# Patient Record
Sex: Female | Born: 1980 | Race: White | Hispanic: No | Marital: Married | Smoking: Never smoker
Health system: Southern US, Community
[De-identification: ages and names within clinical notes are randomized; demographics above are authoritative.]

## PROBLEM LIST (undated history)

## (undated) DIAGNOSIS — H409 Unspecified glaucoma: Secondary | ICD-10-CM

## (undated) DIAGNOSIS — K5090 Crohn's disease, unspecified, without complications: Secondary | ICD-10-CM

## (undated) DIAGNOSIS — F909 Attention-deficit hyperactivity disorder, unspecified type: Secondary | ICD-10-CM

## (undated) DIAGNOSIS — Q858 Other phakomatoses, not elsewhere classified: Secondary | ICD-10-CM

## (undated) DIAGNOSIS — E039 Hypothyroidism, unspecified: Secondary | ICD-10-CM

## (undated) DIAGNOSIS — Q8589 Other phakomatoses, not elsewhere classified: Secondary | ICD-10-CM

## (undated) DIAGNOSIS — N289 Disorder of kidney and ureter, unspecified: Secondary | ICD-10-CM

## (undated) HISTORY — PX: EYE SURGERY: SHX253

---

## 2006-05-28 ENCOUNTER — Emergency Department: Payer: Self-pay | Admitting: Emergency Medicine

## 2012-09-22 ENCOUNTER — Emergency Department: Payer: Self-pay | Admitting: Emergency Medicine

## 2012-09-22 LAB — COMPREHENSIVE METABOLIC PANEL
Albumin: 3.9 g/dL (ref 3.4–5.0)
Alkaline Phosphatase: 55 U/L (ref 50–136)
Anion Gap: 5 — ABNORMAL LOW (ref 7–16)
BUN: 17 mg/dL (ref 7–18)
Bilirubin,Total: 0.6 mg/dL (ref 0.2–1.0)
Calcium, Total: 8.8 mg/dL (ref 8.5–10.1)
Chloride: 107 mmol/L (ref 98–107)
Co2: 24 mmol/L (ref 21–32)
Creatinine: 1.11 mg/dL (ref 0.60–1.30)
EGFR (African American): >60
EGFR (Non-African Amer.): >60
Osmolality: 273 (ref 275–301)
Potassium: 3.4 mmol/L — ABNORMAL LOW (ref 3.5–5.1)
SGOT(AST): 25 U/L (ref 15–37)
SGPT (ALT): 30 U/L (ref 12–78)
Sodium: 136 mmol/L (ref 136–145)

## 2012-09-22 LAB — PREGNANCY, URINE: Pregnancy Test, Urine: NEGATIVE m[IU]/mL

## 2012-09-22 LAB — URINALYSIS, COMPLETE
Ketone: NEGATIVE
Leukocyte Esterase: NEGATIVE
Ph: 6 (ref 4.5–8.0)
RBC,UR: 1 /HPF (ref 0–5)
Specific Gravity: 1.002 (ref 1.003–1.030)
WBC UR: <1 /HPF (ref 0–5)

## 2012-09-22 LAB — CBC
HCT: 36.9 % (ref 35.0–47.0)
HGB: 12.1 g/dL (ref 12.0–16.0)
MCH: 31.2 pg (ref 26.0–34.0)
MCHC: 32.8 g/dL (ref 32.0–36.0)
RBC: 3.88 10*6/uL (ref 3.80–5.20)
RDW: 13.1 % (ref 11.5–14.5)

## 2012-10-14 ENCOUNTER — Other Ambulatory Visit: Payer: Self-pay | Admitting: Gastroenterology

## 2012-10-14 LAB — CLOSTRIDIUM DIFFICILE BY PCR

## 2012-11-08 ENCOUNTER — Other Ambulatory Visit: Payer: Self-pay | Admitting: Gastroenterology

## 2012-11-08 LAB — CLOSTRIDIUM DIFFICILE BY PCR

## 2012-12-09 ENCOUNTER — Other Ambulatory Visit: Payer: Self-pay | Admitting: Gastroenterology

## 2012-12-09 LAB — CLOSTRIDIUM DIFFICILE BY PCR

## 2012-12-26 ENCOUNTER — Other Ambulatory Visit: Payer: Self-pay | Admitting: Gastroenterology

## 2012-12-26 LAB — CLOSTRIDIUM DIFFICILE BY PCR

## 2013-03-06 ENCOUNTER — Other Ambulatory Visit: Payer: Self-pay | Admitting: Family

## 2013-03-06 LAB — COMPREHENSIVE METABOLIC PANEL
BUN: 11 mg/dL (ref 7–18)
Bilirubin,Total: 0.5 mg/dL (ref 0.2–1.0)
Calcium, Total: 9 mg/dL (ref 8.5–10.1)
Creatinine: 1.15 mg/dL (ref 0.60–1.30)
EGFR (Non-African Amer.): >60
Glucose: 88 mg/dL (ref 65–99)
Potassium: 3.5 mmol/L (ref 3.5–5.1)
Sodium: 137 mmol/L (ref 136–145)
Total Protein: 8.4 g/dL — ABNORMAL HIGH (ref 6.4–8.2)

## 2013-03-06 LAB — CBC WITH DIFFERENTIAL/PLATELET
Basophil #: 0.1 10*3/uL (ref 0.0–0.1)
Basophil %: 0.8 %
Eosinophil %: 2.4 %
HGB: 12.8 g/dL (ref 12.0–16.0)
Lymphocyte %: 18.5 %
MCHC: 34.9 g/dL (ref 32.0–36.0)
MCV: 92 fL (ref 80–100)
Monocyte %: 9 %
Neutrophil #: 5.3 10*3/uL (ref 1.4–6.5)
Platelet: 340 10*3/uL (ref 150–440)
RBC: 3.97 10*6/uL (ref 3.80–5.20)
WBC: 7.6 10*3/uL (ref 3.6–11.0)

## 2013-03-06 LAB — IRON AND TIBC: Iron Saturation: 32 %

## 2013-03-06 LAB — SEDIMENTATION RATE: Erythrocyte Sed Rate: 61 mm/hr — ABNORMAL HIGH (ref 0–20)

## 2013-04-21 ENCOUNTER — Ambulatory Visit: Payer: Self-pay | Admitting: Family Medicine

## 2014-06-02 ENCOUNTER — Ambulatory Visit: Payer: Self-pay | Admitting: Neurology

## 2014-06-19 ENCOUNTER — Ambulatory Visit: Payer: Self-pay | Admitting: Neurology

## 2014-09-22 ENCOUNTER — Emergency Department: Payer: Self-pay | Admitting: Emergency Medicine

## 2017-02-12 ENCOUNTER — Other Ambulatory Visit: Payer: Self-pay | Admitting: Family Medicine

## 2017-02-12 DIAGNOSIS — E039 Hypothyroidism, unspecified: Secondary | ICD-10-CM

## 2017-02-12 DIAGNOSIS — E041 Nontoxic single thyroid nodule: Secondary | ICD-10-CM

## 2017-02-21 ENCOUNTER — Ambulatory Visit
Admission: RE | Admit: 2017-02-21 | Discharge: 2017-02-21 | Disposition: A | Discharge status: Home / Self Care | Payer: Commercial Managed Care - PPO | Source: Ambulatory Visit | Attending: Family Medicine | Admitting: Family Medicine

## 2017-02-21 DIAGNOSIS — E041 Nontoxic single thyroid nodule: Secondary | ICD-10-CM

## 2017-02-21 DIAGNOSIS — N261 Atrophy of kidney (terminal): Secondary | ICD-10-CM | POA: Insufficient documentation

## 2017-02-21 DIAGNOSIS — E039 Hypothyroidism, unspecified: Secondary | ICD-10-CM

## 2018-08-21 ENCOUNTER — Other Ambulatory Visit: Payer: Self-pay | Admitting: Family Medicine

## 2018-08-21 DIAGNOSIS — E041 Nontoxic single thyroid nodule: Secondary | ICD-10-CM

## 2019-11-21 ENCOUNTER — Other Ambulatory Visit: Payer: Self-pay | Admitting: Family Medicine

## 2019-11-21 DIAGNOSIS — Q858 Other phakomatoses, not elsewhere classified: Secondary | ICD-10-CM

## 2019-11-21 DIAGNOSIS — H42 Glaucoma in diseases classified elsewhere: Secondary | ICD-10-CM

## 2019-11-21 DIAGNOSIS — Q8589 Other phakomatoses, not elsewhere classified: Secondary | ICD-10-CM

## 2019-11-21 DIAGNOSIS — E049 Nontoxic goiter, unspecified: Secondary | ICD-10-CM

## 2019-12-01 ENCOUNTER — Other Ambulatory Visit: Payer: Self-pay

## 2019-12-01 ENCOUNTER — Ambulatory Visit
Admission: RE | Admit: 2019-12-01 | Discharge: 2019-12-01 | Disposition: A | Discharge status: Home / Self Care | Payer: BC Managed Care – PPO | Source: Ambulatory Visit | Attending: Family Medicine | Admitting: Family Medicine

## 2019-12-01 DIAGNOSIS — Q8589 Other phakomatoses, not elsewhere classified: Secondary | ICD-10-CM

## 2019-12-01 DIAGNOSIS — Q858 Other phakomatoses, not elsewhere classified: Secondary | ICD-10-CM | POA: Insufficient documentation

## 2019-12-01 DIAGNOSIS — E049 Nontoxic goiter, unspecified: Secondary | ICD-10-CM | POA: Insufficient documentation

## 2019-12-01 DIAGNOSIS — H42 Glaucoma in diseases classified elsewhere: Secondary | ICD-10-CM | POA: Diagnosis present

## 2020-12-10 ENCOUNTER — Encounter: Payer: Self-pay | Admitting: Emergency Medicine

## 2020-12-10 ENCOUNTER — Emergency Department
Admission: EM | Admit: 2020-12-10 | Discharge: 2020-12-10 | Disposition: A | Discharge status: Home / Self Care | Payer: No Typology Code available for payment source | Attending: Emergency Medicine | Admitting: Emergency Medicine

## 2020-12-10 ENCOUNTER — Other Ambulatory Visit: Payer: Self-pay

## 2020-12-10 ENCOUNTER — Emergency Department: Payer: No Typology Code available for payment source

## 2020-12-10 DIAGNOSIS — Q8589 Other phakomatoses, not elsewhere classified: Secondary | ICD-10-CM

## 2020-12-10 DIAGNOSIS — R55 Syncope and collapse: Secondary | ICD-10-CM | POA: Insufficient documentation

## 2020-12-10 DIAGNOSIS — I679 Cerebrovascular disease, unspecified: Secondary | ICD-10-CM | POA: Diagnosis not present

## 2020-12-10 DIAGNOSIS — W228XXA Striking against or struck by other objects, initial encounter: Secondary | ICD-10-CM | POA: Insufficient documentation

## 2020-12-10 DIAGNOSIS — Z23 Encounter for immunization: Secondary | ICD-10-CM | POA: Insufficient documentation

## 2020-12-10 DIAGNOSIS — Z91040 Latex allergy status: Secondary | ICD-10-CM | POA: Diagnosis not present

## 2020-12-10 DIAGNOSIS — Y9301 Activity, walking, marching and hiking: Secondary | ICD-10-CM | POA: Insufficient documentation

## 2020-12-10 DIAGNOSIS — Q858 Other phakomatoses, not elsewhere classified: Secondary | ICD-10-CM

## 2020-12-10 DIAGNOSIS — S00211A Abrasion of right eyelid and periocular area, initial encounter: Secondary | ICD-10-CM | POA: Insufficient documentation

## 2020-12-10 DIAGNOSIS — E039 Hypothyroidism, unspecified: Secondary | ICD-10-CM | POA: Insufficient documentation

## 2020-12-10 DIAGNOSIS — G463 Brain stem stroke syndrome: Secondary | ICD-10-CM | POA: Diagnosis not present

## 2020-12-10 DIAGNOSIS — S0591XA Unspecified injury of right eye and orbit, initial encounter: Secondary | ICD-10-CM | POA: Diagnosis present

## 2020-12-10 HISTORY — DX: Unspecified glaucoma: H40.9

## 2020-12-10 HISTORY — DX: Other phakomatoses, not elsewhere classified: Q85.8

## 2020-12-10 HISTORY — DX: Other phakomatoses, not elsewhere classified: Q85.89

## 2020-12-10 HISTORY — DX: Hypothyroidism, unspecified: E03.9

## 2020-12-10 HISTORY — DX: Crohn's disease, unspecified, without complications: K50.90

## 2020-12-10 LAB — URINALYSIS, COMPLETE (UACMP) WITH MICROSCOPIC
Bacteria, UA: NONE SEEN
Bilirubin Urine: NEGATIVE
Glucose, UA: NEGATIVE mg/dL
Ketones, ur: NEGATIVE mg/dL
Leukocytes,Ua: NEGATIVE
Nitrite: NEGATIVE
Protein, ur: 100 mg/dL — AB
Specific Gravity, Urine: 1.011 (ref 1.005–1.030)
pH: 6 (ref 5.0–8.0)

## 2020-12-10 LAB — BASIC METABOLIC PANEL
Anion gap: 8 (ref 5–15)
BUN: 24 mg/dL — ABNORMAL HIGH (ref 6–20)
CO2: 23 mmol/L (ref 22–32)
Calcium: 8.9 mg/dL (ref 8.9–10.3)
Chloride: 104 mmol/L (ref 98–111)
Creatinine, Ser: 1.4 mg/dL — ABNORMAL HIGH (ref 0.44–1.00)
GFR, Estimated: 50 mL/min — ABNORMAL LOW (ref >60)
Glucose, Bld: 124 mg/dL — ABNORMAL HIGH (ref 70–99)
Potassium: 3.6 mmol/L (ref 3.5–5.1)
Sodium: 135 mmol/L (ref 135–145)

## 2020-12-10 LAB — CBC
HCT: 35.7 % — ABNORMAL LOW (ref 36.0–46.0)
Hemoglobin: 11.9 g/dL — ABNORMAL LOW (ref 12.0–15.0)
MCH: 30.6 pg (ref 26.0–34.0)
MCHC: 33.3 g/dL (ref 30.0–36.0)
MCV: 91.8 fL (ref 80.0–100.0)
Platelets: 391 10*3/uL (ref 150–400)
RBC: 3.89 MIL/uL (ref 3.87–5.11)
RDW: 15.1 % (ref 11.5–15.5)
WBC: 5.3 10*3/uL (ref 4.0–10.5)
nRBC: 0 % (ref 0.0–0.2)

## 2020-12-10 LAB — PREGNANCY, URINE: Preg Test, Ur: NEGATIVE

## 2020-12-10 MED ORDER — ACETAMINOPHEN 500 MG PO TABS
1000.0000 mg | ORAL_TABLET | Freq: Once | ORAL | Status: AC
  Administered 2020-12-10: 1000 mg via ORAL
  Filled 2020-12-10: qty 2

## 2020-12-10 MED ORDER — TETANUS-DIPHTH-ACELL PERTUSSIS 5-2.5-18.5 LF-MCG/0.5 IM SUSY
0.5000 mL | PREFILLED_SYRINGE | Freq: Once | INTRAMUSCULAR | Status: AC
  Administered 2020-12-10: 0.5 mL via INTRAMUSCULAR
  Filled 2020-12-10: qty 0.5

## 2020-12-10 NOTE — ED Triage Notes (Signed)
Pt to ED via [facility] from [place] of [employer] where pt was walking and became lightheaded and fell into brick wall head first. Pt reports LOC. Pt has injury to rt eye brow.

## 2020-12-10 NOTE — Discharge Instructions (Addendum)
Your CT scan is as below and you can discuss this further with Dr. Manuella Ghazi.  Please call them to make an appointment and do not drive until you get follow-up.  Return to the ER if you have recurrent syncopal versus seizure episode, worsening headache, confusion or any other concerns    Atrophy for age with ventricles disproportionate in size with respect to sulci, a stable finding. Calcification along the periphery of the right occipital lobe is stable and consistent with known Sturge-Weber syndrome.   No appreciable mass or hemorrhage. Suspect decreased attenuation adjacent to the lateral ventricles is due to interstitial edema secondary to chronic ventricular enlargement. Mild small vessel disease in the periventricular white matter is possible. No acute infarct is evident on this study.  Per [facility] statutes, patients with seizures are not allowed to drive until  they have been seizure-free for six months. Use caution when using heavy equipment or power tools. Avoid working on ladders or at heights. Take showers instead of baths. Ensure the water temperature is not too high on the home water heater. Do not go swimming alone. When caring for infants or small children, sit down when holding, feeding, or changing them to minimize risk of injury to the child in the event you have a seizure.   Also, Maintain good sleep hygiene. Avoid alcohol.

## 2020-12-10 NOTE — ED Triage Notes (Signed)
Pt to ED via  [facility] with c/o +LOC and seizure like activity. Pt states sturgey-webber syndrome, has hx of seizures but has not had a seizure since she was a kid. Pt with noted abrasion to R eye brow. Pt A&O x4 during triage.

## 2020-12-10 NOTE — ED Notes (Signed)
IV catheter removed intact without complication.  D/C instructions given.  Follow up discussed.  All questions addressed.  Pt advised of no driving.  Understanding verbalized.  Pt left ER via w/c with husband.

## 2020-12-10 NOTE — ED Provider Notes (Addendum)
[facility] Emergency Department Provider Note  ____________________________________________   Event Date/Time   First MD Initiated Contact with Patient 12/10/20 1500     (approximate)  I have reviewed the triage vital signs and the nursing notes.   HISTORY  Chief Complaint Loss of Consciousness    HPI Nicole Moody is a 40 y.o. female with Sturge-Weber syndrome who comes in for syncope.  Patient reports that she did not eat anything yet today.  She was standing in line waiting to check out food from the [employer] store when she started to feel lightheaded and her vision went black and she lost consciousness.  There is report from bystanders that patient hit her head and had some seizure-like activity.  Patient denies any tongue biting, urinary incontinence or postictal.  Patient does have an abrasion above the right eye.  She reports some mild headache constant, occurred just prior to arrival around 4 hours ago.  Patient denies any chest pain, shortness of breath, abdominal pain.  States that she otherwise felt fine this morning.  Patient just got off of her menstruation.  Denies concerns for being pregnant given her partner has a vasectomy.  Patient reports a remote history of seizures as a child but is not on any seizure medications.  Patient is followed by neurology Dr. Brigitte Pulse at [facility].  Denies blood thinner.           Past Medical History:  Diagnosis Date  . Crohn disease ([place])   . Glaucoma   . Hypothyroidism   . Sturge-Weber syndrome ([place])     There are no problems to display for this patient.    Prior to Admission medications   Not on File    Allergies Latex  No family history on file.  Social History Social History   Tobacco Use  . Smoking status: Never Smoker  . Smokeless tobacco: Never Used  Substance Use Topics  . Alcohol use: Not Currently  . Drug use: Yes    Types: Marijuana    Comment: occ      Review  of Systems Constitutional: No fever/chills Eyes: No visual changes. ENT: No sore throat. Cardiovascular: Denies chest pain. Respiratory: Denies shortness of breath. Gastrointestinal: No abdominal pain.  No nausea, no vomiting.  No diarrhea.  No constipation. Genitourinary: Negative for dysuria. Musculoskeletal: Negative for back pain. Skin: Negative for rash. Neurological: Negative for headaches, focal weakness or numbness.  Syncope, seizure-like activity All other ROS negative ____________________________________________   PHYSICAL EXAM:  VITAL SIGNS: ED Triage Vitals  Enc Vitals Group     BP 12/10/20 1137 116/84     Pulse Rate 12/10/20 1137 87     Resp 12/10/20 1137 20     Temp 12/10/20 1137 97.9 F (36.6 C)     Temp Source 12/10/20 1137 Oral     SpO2 12/10/20 1137 97 %     Weight 12/10/20 1138 190 lb (86.2 kg)     Height 12/10/20 1138 5' 3"  (1.6 m)     Head Circumference --      Peak Flow --      Pain Score 12/10/20 1137 4     Pain Loc --      Pain Edu? --      Excl. in [place]? --     Constitutional: Alert and oriented. Well appearing and in no acute distress. Eyes: Conjunctivae are normal. EOMI. small abrasion above the right eye.  No tenderness underneath the orbits Head:  Small abrasion above the right eye Nose: No congestion/rhinnorhea. Mouth/Throat: Mucous membranes are moist.   Neck: No stridor. Trachea Midline. FROM.  No C-spine tenderness Cardiovascular: Normal rate, regular rhythm. Grossly normal heart sounds.  Good peripheral circulation. Respiratory: Normal respiratory effort.  No retractions. Lungs CTAB. Gastrointestinal: Soft and nontender. No distention. No abdominal bruits.  Musculoskeletal: No lower extremity tenderness nor edema.  No joint effusions. Neurologic:  Normal speech and language. No gross focal neurologic deficits are appreciated.  Cranial nerves II through XII are intact.  Equal strength in arms and legs. Skin:  Skin is warm, dry and intact.   Port wine stains noted Psychiatric: Mood and affect are normal. Speech and behavior are normal. GU: Deferred   ____________________________________________   LABS (all labs ordered are listed, but only abnormal results are displayed)  Labs Reviewed  BASIC METABOLIC PANEL - Abnormal; Notable for the following components:      Result Value   Glucose, Bld 124 (*)    BUN 24 (*)    Creatinine, Ser 1.40 (*)    GFR, Estimated 50 (*)    All other components within normal limits  CBC - Abnormal; Notable for the following components:   Hemoglobin 11.9 (*)    HCT 35.7 (*)    All other components within normal limits  URINALYSIS, COMPLETE (UACMP) WITH MICROSCOPIC - Abnormal; Notable for the following components:   Color, Urine YELLOW (*)    APPearance HAZY (*)    Hgb urine dipstick LARGE (*)    Protein, ur 100 (*)    All other components within normal limits  CBG MONITORING, ED  POC URINE PREG, ED   ____________________________________________   ED ECG REPORT I, Vanessa Logan, the attending physician, personally viewed and interpreted this ECG.  Sinus rate of 82, no ST elevation, no T wave inversions PAC, normal intervals ____________________________________________  RADIOLOGY   Official radiology report(s): CT Head Wo Contrast  Result Date: 12/10/2020 CLINICAL DATA:  Seizure-like activity of loss of consciousness history of Sturge-Weber syndrome EXAM: CT HEAD WITHOUT CONTRAST TECHNIQUE: Contiguous axial images were obtained from the base of the skull through the vertex without intravenous contrast. COMPARISON:  Head CT June 02, 2014; brain MRI June 19, 2014 FINDINGS: Brain: Ventricular dilatation is stable compared to the 2015 study. There is a lesser degree of sulcal atrophy but nonetheless advanced for age. Dystrophic calcification along the periphery of the right occipital lobe is stable and consistent with known Sturge-Weber syndrome. Localized atrophy noted in this area.  There is no appreciable mass. No acute hemorrhage, extra-axial fluid collection, or midline shift. There is decreased attenuation in the periventricular white matter which may represent mild interstitial edema secondary to ventricular enlargement. There may also be a degree of periventricular small vessel disease. No acute infarct appreciable. Vascular: No evident hyperdense vessels. No vascular calcifications are evident. Skull: Bony calvarium   appears intact. Sinuses/Orbits: Visualized paranasal sinuses are clear. Visualized orbits appear symmetric bilaterally. Other: Mastoid air cells are clear. IMPRESSION: Atrophy for age with ventricles disproportionate in size with respect to sulci, a stable finding. Calcification along the periphery of the right occipital lobe is stable and consistent with known Sturge-Weber syndrome. No appreciable mass or hemorrhage. Suspect decreased attenuation adjacent to the lateral ventricles is due to interstitial edema secondary to chronic ventricular enlargement. Mild small vessel disease in the periventricular white matter is possible. No acute infarct is evident on this study. Electronically Signed   By: Lowella Grip III M.D.  On: 12/10/2020 13:06    ____________________________________________   PROCEDURES  Procedure(s) performed (including Critical Care):  Procedures   ____________________________________________   INITIAL IMPRESSION / ASSESSMENT AND PLAN / ED COURSE  Nicole Moody was evaluated in Emergency Department on 12/10/2020 for the symptoms described in the history of present illness. She was evaluated in the context of the global COVID-19 pandemic, which necessitated consideration that the patient might be at risk for infection with the SARS-CoV-2 virus that causes COVID-19. Institutional protocols and algorithms that pertain to the evaluation of patients at risk for COVID-19 are in a state of rapid change based on information released by  regulatory bodies including the CDC and federal and state organizations. These policies and algorithms were followed during the patient's care in the ED.    Patient comes in with syncopal versus seizure-like episode.  Discussed with patient that syncope can have seizure-like activity as well but given her history of seizure is definitely possible although given the no postictal period, urinary incontinence seems less likely could have had syncope secondary to dehydration and missing eating this morning.  Labs ordered to evaluate for anemia, electrolyte abnormalities, pregnancy.  No chest pain or shortness of breath to suggest PE or cardiac event.  Patient is neurologically intact at this time and no evidence of stroke.  Discussed with patient that I can talk to our neurologist here but patient stated that she already has a neurologist that she would like to follow-up with instead.  She states that her neurologist knows her very well and they can decide whether or not she needs to be started on antiepileptic.  Discussed with patient that she should not drive until she gets clearance from her neurologist.  Patient had CT head ordered from triage to evaluate for intercranial hemorrhage, CT cervical was not ordered however patient has no C-spine tenderness, cervical motion is intact and no numbness or tingling down her arms.  She also did not have a CT face but she is got no orbital tenderness and the abrasion is on the right frontal bone therefore I do not feel like repeat CT imaging is necessary at this time given low suspicion for orbital fracture.  Labs are reassuring.  CT scan show some chronic findings of her Sturge-Weber syndrome.   Discussed with patient not driving until follow-up and will call Dr. Brigitte Pulse tomorrow  Patient is been in the ER over 4 hours without recurrent episode and she feels comfortable with discharge home  I discussed the provisional nature of ED diagnosis, the treatment so far, the  ongoing plan of care, follow up appointments and return precautions with the patient and any family or support people present. They expressed understanding and agreed with the plan, discharged home.          ____________________________________________   FINAL CLINICAL IMPRESSION(S) / ED DIAGNOSES   Final diagnoses:  Syncope and collapse  Sturge-Weber syndrome (HCC)      MEDICATIONS GIVEN DURING THIS VISIT:  Medications  Tdap (BOOSTRIX) injection 0.5 mL (has no administration in time range)  acetaminophen (TYLENOL) tablet 1,000 mg (1,000 mg Oral Given 12/10/20 1552)     ED Discharge Orders    None       Note:  This document was prepared using Dragon voice recognition software and may include unintentional dictation errors.   Vanessa Clarinda, MD 12/10/20 1554    Vanessa , MD 12/10/20 [id number]

## 2021-04-18 ENCOUNTER — Other Ambulatory Visit: Payer: Self-pay | Admitting: Student

## 2021-04-18 DIAGNOSIS — Z8669 Personal history of other diseases of the nervous system and sense organs: Secondary | ICD-10-CM

## 2021-04-21 ENCOUNTER — Ambulatory Visit: Payer: No Typology Code available for payment source

## 2021-04-27 ENCOUNTER — Emergency Department: Payer: No Typology Code available for payment source

## 2021-04-27 ENCOUNTER — Ambulatory Visit
Admission: RE | Admit: 2021-04-27 | Discharge: 2021-04-27 | Disposition: A | Discharge status: Home / Self Care | Payer: No Typology Code available for payment source | Source: Ambulatory Visit | Attending: Student | Admitting: Student

## 2021-04-27 ENCOUNTER — Other Ambulatory Visit: Payer: Self-pay

## 2021-04-27 ENCOUNTER — Encounter: Payer: Self-pay | Admitting: Emergency Medicine

## 2021-04-27 ENCOUNTER — Emergency Department
Admission: EM | Admit: 2021-04-27 | Discharge: 2021-04-27 | Disposition: A | Discharge status: Home / Self Care | Payer: No Typology Code available for payment source | Attending: Emergency Medicine | Admitting: Emergency Medicine

## 2021-04-27 DIAGNOSIS — Z91040 Latex allergy status: Secondary | ICD-10-CM | POA: Insufficient documentation

## 2021-04-27 DIAGNOSIS — Z8669 Personal history of other diseases of the nervous system and sense organs: Secondary | ICD-10-CM | POA: Insufficient documentation

## 2021-04-27 DIAGNOSIS — Q8589 Other phakomatoses, not elsewhere classified: Secondary | ICD-10-CM | POA: Insufficient documentation

## 2021-04-27 DIAGNOSIS — E039 Hypothyroidism, unspecified: Secondary | ICD-10-CM | POA: Insufficient documentation

## 2021-04-27 DIAGNOSIS — R569 Unspecified convulsions: Secondary | ICD-10-CM | POA: Insufficient documentation

## 2021-04-27 DIAGNOSIS — I629 Nontraumatic intracranial hemorrhage, unspecified: Secondary | ICD-10-CM | POA: Diagnosis not present

## 2021-04-27 DIAGNOSIS — R9389 Abnormal findings on diagnostic imaging of other specified body structures: Secondary | ICD-10-CM | POA: Diagnosis present

## 2021-04-27 HISTORY — DX: Attention-deficit hyperactivity disorder, unspecified type: F90.9

## 2021-04-27 HISTORY — DX: Disorder of kidney and ureter, unspecified: N28.9

## 2021-04-27 LAB — COMPREHENSIVE METABOLIC PANEL
ALT: 16 U/L (ref 0–44)
AST: 20 U/L (ref 15–41)
Albumin: 3.7 g/dL (ref 3.5–5.0)
Alkaline Phosphatase: 39 U/L (ref 38–126)
Anion gap: 6 (ref 5–15)
BUN: 29 mg/dL — ABNORMAL HIGH (ref 6–20)
CO2: 26 mmol/L (ref 22–32)
Calcium: 8.9 mg/dL (ref 8.9–10.3)
Chloride: 105 mmol/L (ref 98–111)
Creatinine, Ser: 1.55 mg/dL — ABNORMAL HIGH (ref 0.44–1.00)
GFR, Estimated: 44 mL/min — ABNORMAL LOW (ref >60)
Glucose, Bld: 75 mg/dL (ref 70–99)
Potassium: 4.3 mmol/L (ref 3.5–5.1)
Sodium: 137 mmol/L (ref 135–145)
Total Bilirubin: 0.4 mg/dL (ref 0.3–1.2)
Total Protein: 7.6 g/dL (ref 6.5–8.1)

## 2021-04-27 LAB — CBC WITH DIFFERENTIAL/PLATELET
Abs Immature Granulocytes: 0.01 10*3/uL (ref 0.00–0.07)
Basophils Absolute: 0.1 10*3/uL (ref 0.0–0.1)
Basophils Relative: 1 %
Eosinophils Absolute: 0.3 10*3/uL (ref 0.0–0.5)
Eosinophils Relative: 4 %
HCT: 34.6 % — ABNORMAL LOW (ref 36.0–46.0)
Hemoglobin: 11.9 g/dL — ABNORMAL LOW (ref 12.0–15.0)
Immature Granulocytes: 0 %
Lymphocytes Relative: 29 %
Lymphs Abs: 1.7 10*3/uL (ref 0.7–4.0)
MCH: 32.1 pg (ref 26.0–34.0)
MCHC: 34.4 g/dL (ref 30.0–36.0)
MCV: 93.3 fL (ref 80.0–100.0)
Monocytes Absolute: 0.6 10*3/uL (ref 0.1–1.0)
Monocytes Relative: 9 %
Neutro Abs: 3.3 10*3/uL (ref 1.7–7.7)
Neutrophils Relative %: 57 %
Platelets: 331 10*3/uL (ref 150–400)
RBC: 3.71 MIL/uL — ABNORMAL LOW (ref 3.87–5.11)
RDW: 14.6 % (ref 11.5–15.5)
WBC: 5.9 10*3/uL (ref 4.0–10.5)
nRBC: 0 % (ref 0.0–0.2)

## 2021-04-27 LAB — PROTIME-INR
INR: 1 (ref 0.8–1.2)
Prothrombin Time: 13.1 seconds (ref 11.4–15.2)

## 2021-04-27 LAB — APTT: aPTT: 30 seconds (ref 24–36)

## 2021-04-27 LAB — CBG MONITORING, ED: Glucose-Capillary: 81 mg/dL (ref 70–99)

## 2021-04-27 NOTE — ED Triage Notes (Signed)
Pt comes from MRI with abnormal imaging. Pt states that she had the MRI due to having seizures over the last few months.

## 2021-04-27 NOTE — ED Provider Notes (Signed)
[facility] Emergency Department Provider Note  ____________________________________________  Time seen: Approximately 10:15 PM  I have reviewed the triage vital signs and the nursing notes.   HISTORY  Chief Complaint Abnormal MRI    HPI Nicole Moody is a 40 y.o. 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  Patient reports that over the last few months she has had 2 seizures.  She is following up with neurology Dr. Manuella Ghazi who has performed an EEG, result not available yet.  Today she had an outpatient MRI as part of this neurology evaluation, and it was noted to be abnormal so she was brought to the ED immediately for suspected intracranial hemorrhage.  Patient denies any significant headache or head trauma.  No new vision changes paresthesias or motor weakness.  No change in balance or coordination.  She feels asymptomatic and at her baseline state of health.  No fever chills or neck pain.    Past Medical History:  Diagnosis Date   ADHD    Crohn disease ([place])    Glaucoma    Hypothyroidism    Renal disorder    Sturge-Weber syndrome      There are no problems to display for this patient.    Past Surgical History:  Procedure Laterality Date   EYE SURGERY     Shunt placed to R eye     Prior to Admission medications   Not on File     Allergies Latex   No family history on file.  Social History Social History   Tobacco Use   Smoking status: Never   Smokeless tobacco: Never  Substance Use Topics   Alcohol use: Not Currently   Drug use: Yes    Types: Marijuana    Comment: occ    Review of Systems  Constitutional:   No fever or chills.  ENT:   No sore throat. No rhinorrhea. Cardiovascular:   No chest pain or syncope. Respiratory:   No dyspnea or cough. Gastrointestinal:   Negative for abdominal pain, vomiting and diarrhea.  Musculoskeletal:   Negative for  focal pain or swelling All other systems reviewed and are negative except as documented above in ROS and HPI.  ____________________________________________   PHYSICAL EXAM:  VITAL SIGNS: ED Triage Vitals  Enc Vitals Group     BP 04/27/21 2029 (!) 152/104     Pulse Rate 04/27/21 2029 86     Resp 04/27/21 2029 18     Temp 04/27/21 2029 98.1 F (36.7 C)     Temp src --      SpO2 04/27/21 2029 100 %     Weight 04/27/21 2026 179 lb 3.7 oz (81.3 kg)     Height 04/27/21 2026 5' 3"  (1.6 m)     Head Circumference --      Peak Flow --      Pain Score 04/27/21 2026 0     Pain Loc --      Pain Edu? --      Excl. in [place]? --     Vital signs reviewed, nursing assessments reviewed.   Constitutional:   Alert and oriented. Non-toxic appearance. Eyes:   Conjunctivae are normal. EOMI. PERRL. ENT      Head:   Normocephalic and atraumatic.      Nose:   Normal      Mouth/Throat:   Moist mucosa      Neck:   No meningismus. Full  ROM. Cardiovascular:   RRR.  Cap refill less than 2 seconds. Respiratory:   Normal respiratory effort without tachypnea/retractions. Musculoskeletal:   Normal range of motion in all extremities. No joint effusions.  No lower extremity tenderness.  No edema. Neurologic:   Normal speech and language.  Motor grossly intact. No acute focal neurologic deficits are appreciated.  Skin:    Skin is warm, dry and intact.  Facial port wine stain noted. no petechiae, purpura, or bullae.  ____________________________________________    LABS (pertinent positives/negatives) (all labs ordered are listed, but only abnormal results are displayed) Labs Reviewed  COMPREHENSIVE METABOLIC PANEL - Abnormal; Notable for the following components:      Result Value   BUN 29 (*)    Creatinine, Ser 1.55 (*)    GFR, Estimated 44 (*)    All other components within normal limits  CBC WITH DIFFERENTIAL/PLATELET - Abnormal; Notable for the following components:   RBC 3.71 (*)    Hemoglobin  11.9 (*)    HCT 34.6 (*)    All other components within normal limits  PROTIME-INR  APTT  CBG MONITORING, ED   ____________________________________________   EKG    ____________________________________________    RADIOLOGY  CT HEAD WO CONTRAST (5MM)  Result Date: 04/27/2021 CLINICAL DATA:  patient comes from MRI with abnormal imaging. Pt states that she had the MRI due to having seizures over the last few months EXAM: CT HEAD WITHOUT CONTRAST TECHNIQUE: Contiguous axial images were obtained from the base of the skull through the vertex without intravenous contrast. COMPARISON:  MRI head 04/27/2021 BRAIN: BRAIN Cerebral ventricle sizes are concordant with the degree of cerebral volume loss. Patchy and confluent areas of decreased attenuation are noted throughout the deep and periventricular white matter of the cerebral hemispheres bilaterally, compatible with chronic microvascular ischemic disease. Redemonstration of calcification along the periphery of the right occipital lobe which is consistent with known Sturge-Weber syndrome. No evidence of large-territorial acute infarction. No parenchymal hemorrhage. No mass lesion. No extra-axial collection. No mass effect or midline shift. No hydrocephalus. Basilar cisterns are patent. Vascular: No hyperdense vessel. Skull: No acute fracture or focal lesion. Sinuses/Orbits: Paranasal sinuses and mastoid air cells are clear. The orbits are unremarkable. Other: None. IMPRESSION: 1. No acute intracranial abnormality in a patient with known Sturge-Weber syndrome. 2. No acute findings that correlates with the MR head 04/27/2021 findings. Findings may be subacute to chronic and difficult to visualize on CT. Electronically Signed   By: Iven Finn M.D.   On: 04/27/2021 21:34   MR BRAIN WO CONTRAST  Result Date: 04/27/2021 CLINICAL DATA:  Initial evaluation for seizures, history of Sturge-Weber syndrome. EXAM: MRI HEAD WITHOUT CONTRAST TECHNIQUE:  Multiplanar, multiecho pulse sequences of the brain and surrounding structures were obtained without intravenous contrast. COMPARISON:  Previous CT from 12/10/2020 as well as prior MRI from 06/19/2014. FINDINGS: Brain: Diffuse prominence of the CSF containing spaces compatible with generalized cerebral atrophy, advanced for age. Patchy T2/FLAIR hyperintensity within the periventricular and deep white matter both cerebral hemispheres, most likely related to chronic microvascular ischemic disease, progressed as compared to previous MRI. Cortical calcifications at the right parieto-occipital region, better seen on prior CT, and consistent with known history of Sturge-Weber syndrome. Localized atrophy seen at this location. There is a heterogeneous T1 hyperintensity overlying the parasagittal left posterior parietal convexity measuring 1.6 x 1.7 x 1.4 cm, concerning for an acute or subacute hemorrhage. This appears to be extra-axial in location, and is favored to be  subarachnoid. Additionally, susceptibility artifact seen interdigitating amongst regional cortical sulci also suspicious for subarachnoid hemorrhage (series 13, image 42). No significant mass effect. No other visible acute intracranial hemorrhage. No mass lesion, mass effect, or midline shift. Asymmetric T2/FLAIR signal abnormality involving the left caudate and lentiform nuclei noted, similar as compared to previous MRI, and could reflect steal phenomenon and/or venous dilatation. Diffuse ventricular prominence with left greater than right lateral ventriculomegaly noted, similar. Third ventricle also mildly prominent. Fourth ventricle of more normal caliber. Appearance is also similar to previous. No evidence for acute or subacute infarct. Vascular: Major intracranial arterial vascular flow voids are well maintained. No visible signal changes to suggest dural sinus thrombosis. Prominent DVA again noted at the right parieto-occipital region. Skull and upper  cervical spine: Craniocervical junction within normal limits. Bone marrow signal intensity diffusely heterogeneous without focal marrow replacing lesion. Slight asymmetric cortical thickening at the right frontal calvarium noted. Sinuses/Orbits: Globes and orbital soft tissues demonstrate no acute finding. T1 hyperintense lesion at the level of the right lacrimal gland noted, similar to previous, possibly a prosthesis. Slight asymmetric right-sided proptosis noted, stable. Hyper pneumatization of the right frontal sinus noted, stable. Paranasal sinuses are largely clear. Trace fluid signal intensity noted within the mastoid air cells bilaterally, of doubtful significance. Inner ear structures grossly normal. Other: Chronic atrophy of the visualized left parotid gland, stable. IMPRESSION: 1. 1.6 x 1.7 x 1.4 cm T1 acute to subacute hemorrhage overlying the parasagittal left parietal convexity with regional subarachnoid hemorrhage as above. Findings favored to reflect a venous subarachnoid bleed. No significant mass effect. Correlation with dedicated CT suggested for complete evaluation. Additionally, further assessment with dedicated catheter directed angiogram also suggested for complete evaluation. 2. Underlying changes of Sturge-Weber syndrome as above, otherwise relatively similar. This patient presented to the MRI department for a routine outpatient follow-up MRI. Given the findings on this study, the patient was transported directly by the MRI technologist to the emergency room for further workup and care. Critical Value/emergent results were called by telephone at the time of interpretation on 04/27/2021 at 8:55 pm to the emergency room physician Dr. Carrie Mew, who verbally acknowledged these results. Electronically Signed   By: Jeannine Boga M.D.   On: 04/27/2021 21:06     ____________________________________________   PROCEDURES Procedures  ____________________________________________    CLINICAL IMPRESSION / ASSESSMENT AND PLAN / ED COURSE  Medications ordered in the ED: Medications - No data to display  Pertinent labs & imaging results that were available during my care of the patient were reviewed by me and considered in my medical decision making (see chart for details).  Yarlin Breisch was evaluated in Emergency Department on 04/27/2021 for the symptoms described in the history of present illness. She was evaluated in the context of the global COVID-19 pandemic, which necessitated consideration that the patient might be at risk for infection with the SARS-CoV-2 virus that causes COVID-19. Institutional protocols and algorithms that pertain to the evaluation of patients at risk for COVID-19 are in a state of rapid change based on information released by regulatory bodies including the CDC and federal and state organizations. These policies and algorithms were followed during the patient's care in the ED.   Patient presents for evaluation due to abnormal MRI.  MRI findings discussed with radiology who recommends CT without contrast to confirm the finding due to diagnostic uncertainty with MRI and possible artifactual interference.  CT obtained and is unremarkable.  No evidence of acute bleeding.  This is congruent with the patient's complete lack of symptoms.  Her most recent seizure was a month ago.  Case discussed with neurosurgery as well who agrees that it would be reasonable for patient to be discharged home and continue her outpatient follow-up.      ____________________________________________   FINAL CLINICAL IMPRESSION(S) / ED DIAGNOSES    Final diagnoses:  Sturge-Weber syndrome     ED Discharge Orders     None       Portions of this note were generated with dragon dictation software. Dictation errors may occur despite  best attempts at proofreading.    Carrie Mew, MD 04/27/21 2221

## 2022-10-09 IMAGING — CT CT HEAD W/O CM
4 series · 16 of 47 positions shown, 18 images · non-contrast
Comparison: Head CT June 02, 2014; brain MRI June 19, 2014

CLINICAL DATA: Seizure-like activity of loss of consciousness
history of Sturge-Weber syndrome

EXAM:
CT HEAD WITHOUT CONTRAST
TECHNIQUE: Contiguous axial images were obtained from the base of the skull
through the vertex without intravenous contrast.

[Series 2: head wo · axial · 0.43mm/px · z∈[+242,+362]mm · 7 of 32 slices shown, 9 images]
[im 4/32  brain]
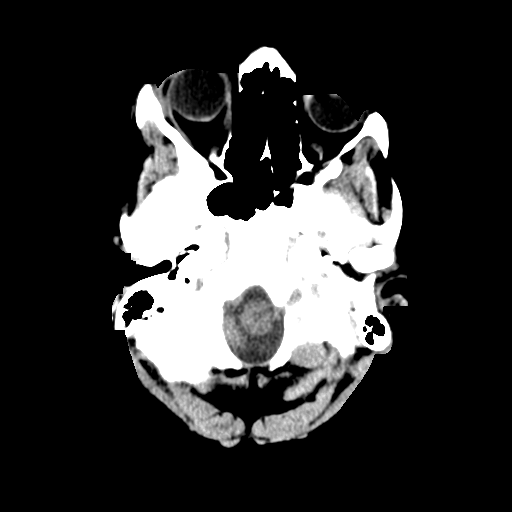
[im 4/32  bone]
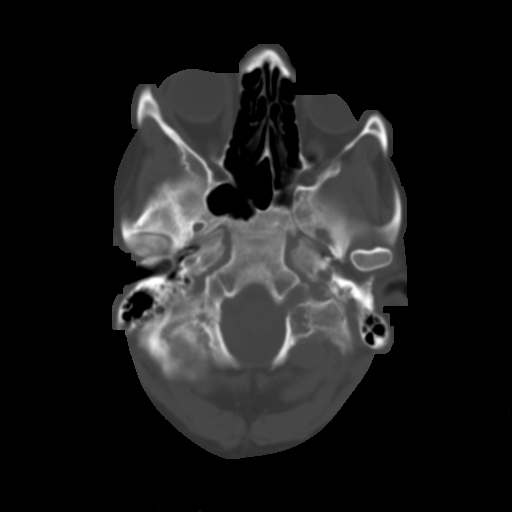
[im 8/32  brain]
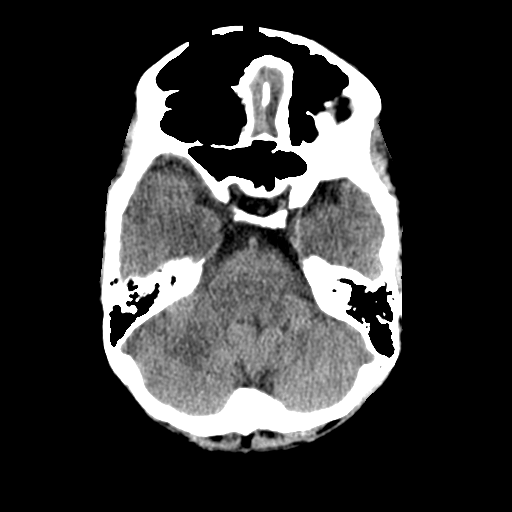
[im 12/32  brain]
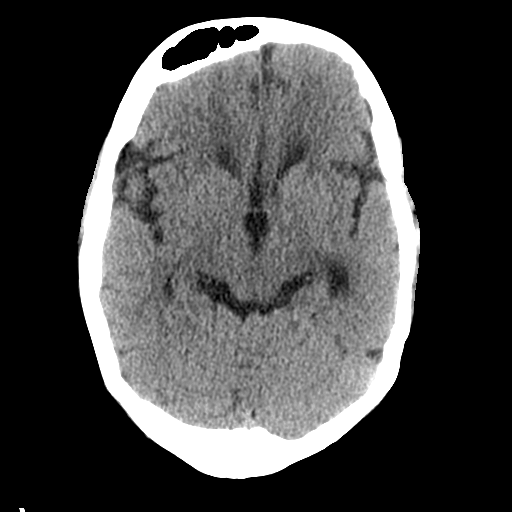
[im 16/32  brain]
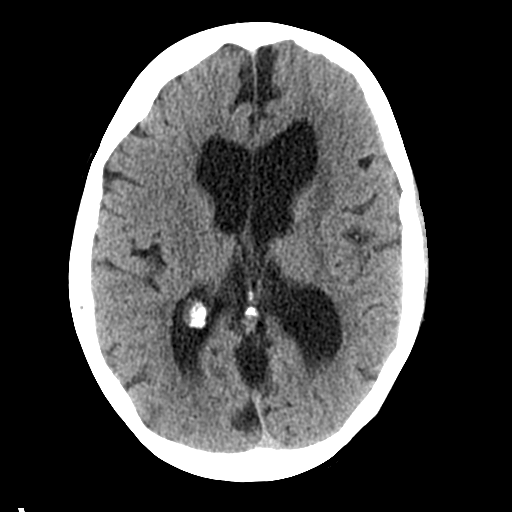
[im 20/32  brain]
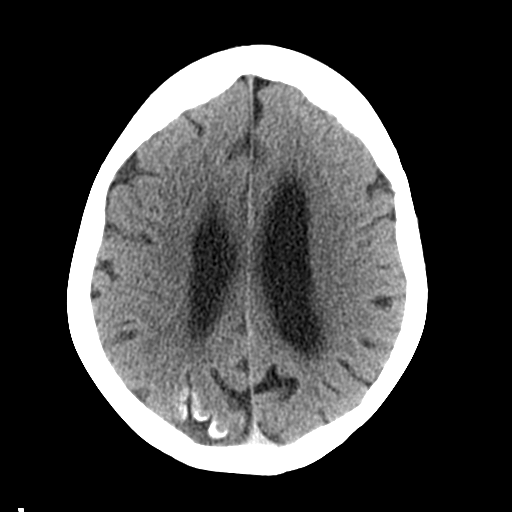
[im 20/32  bone]
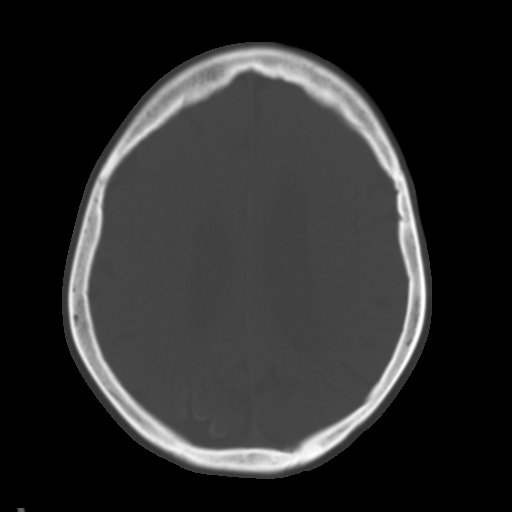
[im 24/32  brain]
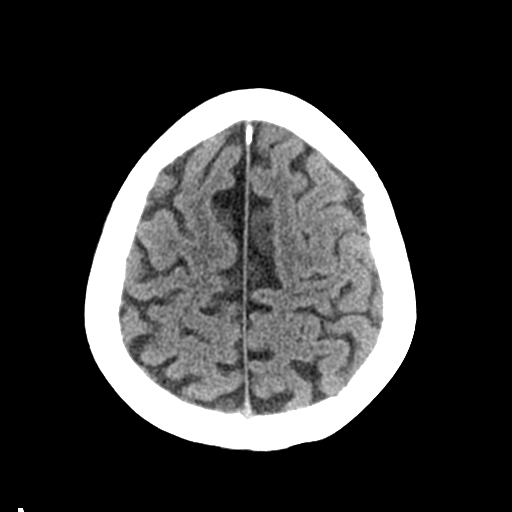
[im 28/32  brain]
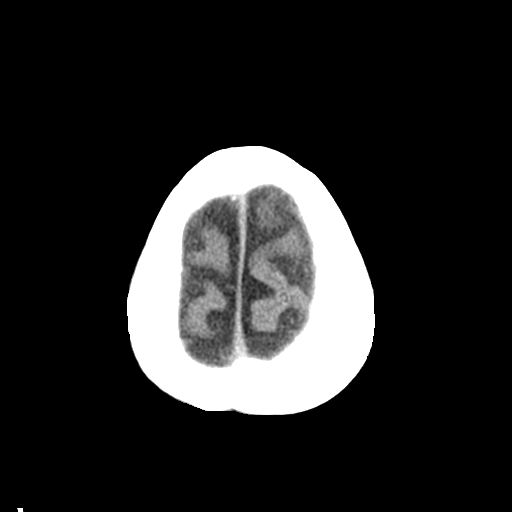

[Series 3: head bone · axial · 0.43mm/px · z∈[+241,+273]mm · 3 of 80 slices shown]
[im 8/80  bone]
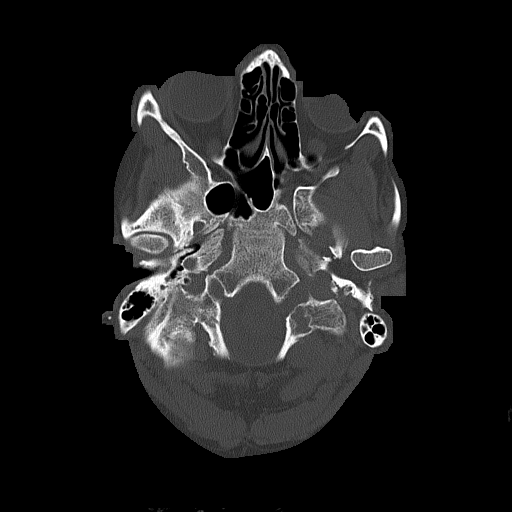
[im 16/80  bone]
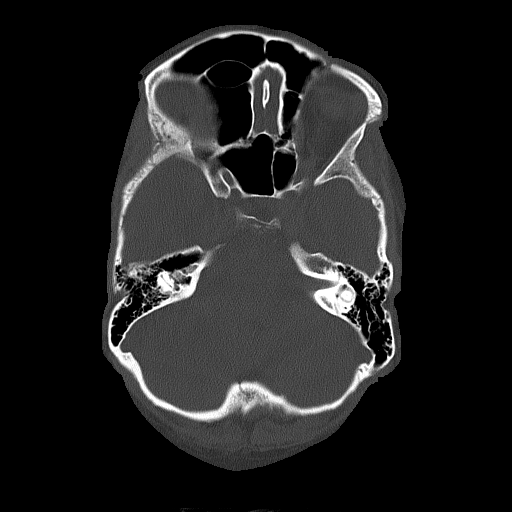
[im 24/80  bone]
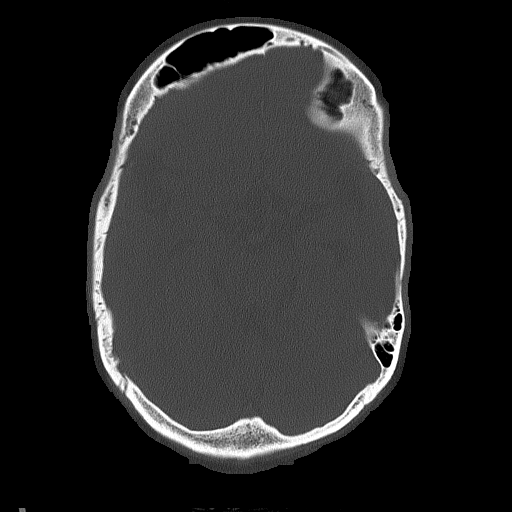

[Series 4: coronal soft tissue · coronal · 0.32mm/px · 3 of 71 slices shown]
[im 24/71  brain]
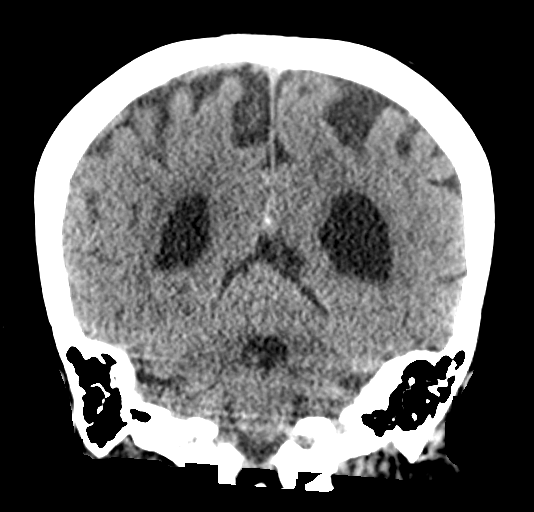
[im 32/71  brain]
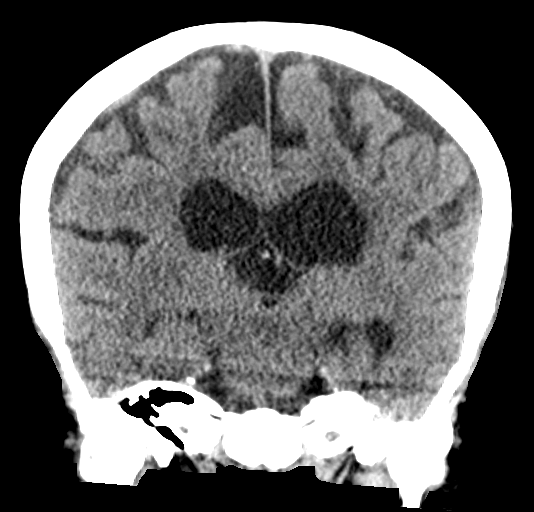
[im 39/71  brain]
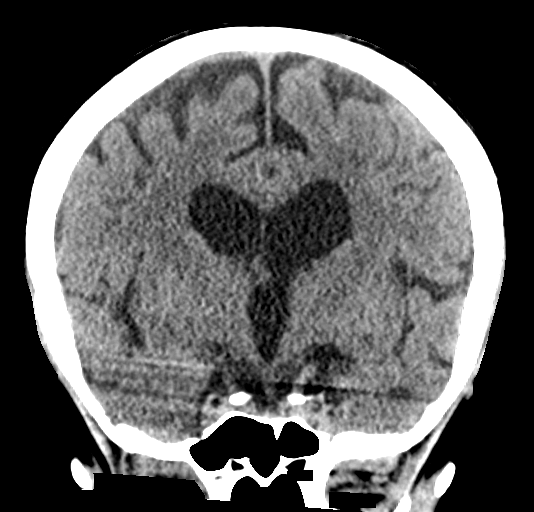

[Series 7: sagittal soft tissue · sagittal · 0.32mm/px · 3 of 57 slices shown]
[im 19/57  brain]
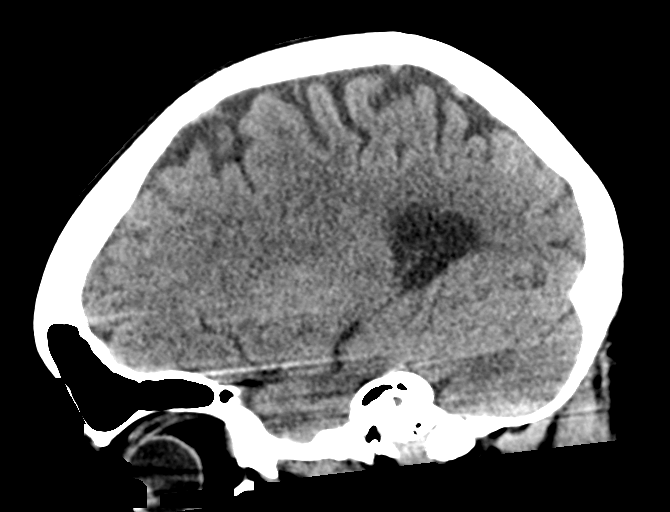
[im 29/57  brain]
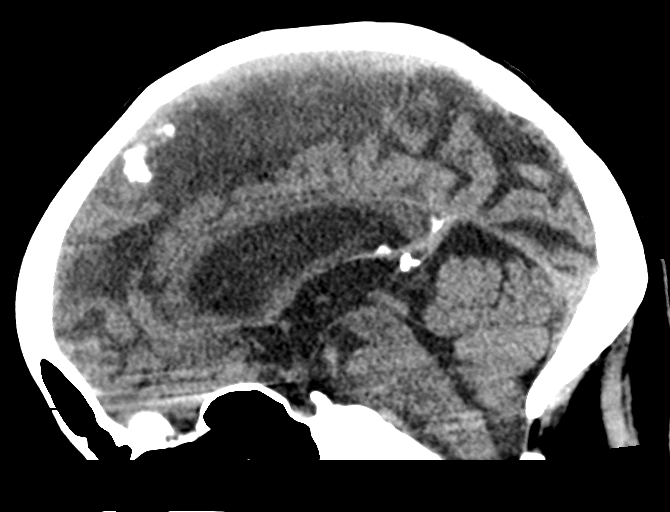
[im 38/57  brain]
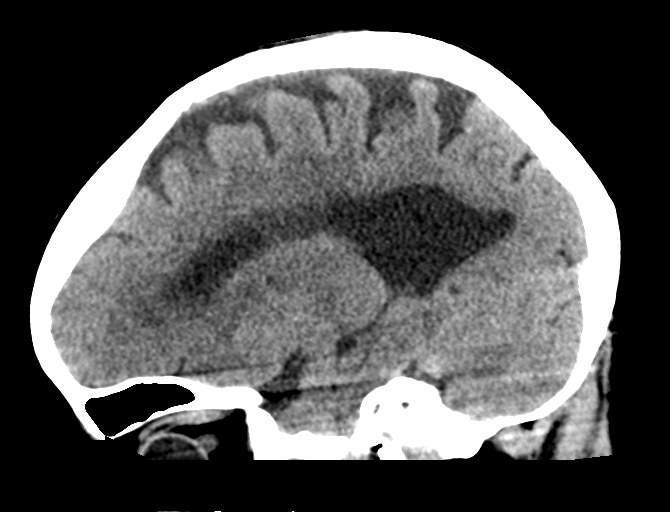

[16 of 47 positions shown; findings below may reference images not displayed]

FINDINGS: Brain: Ventricular dilatation is stable compared to the 8048 study.
There is a lesser degree of sulcal atrophy but nonetheless advanced
for age. Dystrophic calcification along the periphery of the right
occipital lobe is stable and consistent with known Sturge-Weber
syndrome. Localized atrophy noted in this area. There is no
appreciable mass. No acute hemorrhage, extra-axial fluid collection,
or midline shift. There is decreased attenuation in the
periventricular white matter which may represent mild interstitial
edema secondary to ventricular enlargement. There may also be a
degree of periventricular small vessel disease. No acute infarct
appreciable.

Vascular: No evident hyperdense vessels. No vascular calcifications
are evident.

Skull: Bony calvarium   appears intact.

Sinuses/Orbits: Visualized paranasal sinuses are clear. Visualized
orbits appear symmetric bilaterally.

Other: Mastoid air cells are clear.
IMPRESSION: Atrophy for age with ventricles disproportionate in size with
respect to sulci, a stable finding. Calcification along the
periphery of the right occipital lobe is stable and consistent with
known Sturge-Weber syndrome.

No appreciable mass or hemorrhage. Suspect decreased attenuation
adjacent to the lateral ventricles is due to interstitial edema
secondary to chronic ventricular enlargement. Mild small vessel
disease in the periventricular white matter is possible. No acute
infarct is evident on this study.

## 2024-04-30 ENCOUNTER — Other Ambulatory Visit: Payer: Self-pay | Admitting: Family Medicine

## 2024-04-30 DIAGNOSIS — Z1231 Encounter for screening mammogram for malignant neoplasm of breast: Secondary | ICD-10-CM
